# Patient Record
Sex: Male | Born: 2000 | Race: Black or African American | Hispanic: No | Marital: Single | Smoking: Never smoker
Health system: Southern US, Community
[De-identification: ages and names within clinical notes are randomized; demographics above are authoritative.]

## PROBLEM LIST (undated history)

## (undated) DIAGNOSIS — T7840XA Allergy, unspecified, initial encounter: Secondary | ICD-10-CM

## (undated) DIAGNOSIS — Z889 Allergy status to unspecified drugs, medicaments and biological substances status: Secondary | ICD-10-CM

## (undated) DIAGNOSIS — J45909 Unspecified asthma, uncomplicated: Secondary | ICD-10-CM

## (undated) DIAGNOSIS — Z87820 Personal history of traumatic brain injury: Secondary | ICD-10-CM

## (undated) DIAGNOSIS — Z8489 Family history of other specified conditions: Secondary | ICD-10-CM

## (undated) HISTORY — PX: WISDOM TOOTH EXTRACTION: SHX21

---
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
## 2022-08-09 ENCOUNTER — Ambulatory Visit
Admission: RE | Admit: 2022-08-09 | Discharge: 2022-08-09 | Disposition: A | Discharge status: Home / Self Care | Payer: Medicaid Other | Source: Ambulatory Visit | Attending: Physician Assistant | Admitting: Physician Assistant

## 2022-08-09 VITALS — BP 121/83 | HR 60 | Temp 98.4°F | Resp 16

## 2022-08-09 DIAGNOSIS — M25511 Pain in right shoulder: Secondary | ICD-10-CM

## 2022-08-09 MED ORDER — PREDNISONE 20 MG PO TABS: 40.0000 mg | ORAL_TABLET | Freq: Every day | ORAL | 0 refills | Status: AC

## 2022-08-09 NOTE — ED Provider Notes (Signed)
[facility]    CSN: [id number] Arrival date & time: 08/09/22  1701      History   Chief Complaint Chief Complaint  Patient presents with   Shoulder Pain    Entered by patient    HPI Richard [name] is a 21 y.o. male.   Patient here today for evaluation of right shoulder pain that started 2 weeks ago. He reports he does heavy lifting at work and this has exacerbated pain. He denies any new injury but does have history of right shoulder dislocation in the past. He has not been taking any medication for symptoms. He has to work this weekend and would like note for same.   The history is provided by the patient.  Shoulder Pain Associated symptoms: no fever     Past Medical History:  Diagnosis Date   Allergy    seasonal   Asthma    Family history of adverse reaction to anesthesia    Mother - vomitting   H/O seasonal allergies    History of multiple concussions     There are no problems to display for this patient.   Past Surgical History:  Procedure Laterality Date   BANKART REPAIR Right 08/28/2018   Procedure: RIGHT SHOULDER ARTHROSCOPIC BANKART REPAIR;  Surgeon: Nicholes Stairs, MD;  Location: [facility];  Service: Orthopedics;  Laterality: Right;  2 HRS   [place] Medications    Prior to Admission medications   Medication Sig Start Date End Date Taking? Authorizing Provider  predniSONE (DELTASONE) 20 MG tablet Take 2 tablets (40 mg total) by mouth daily with breakfast for 5 days. 08/09/22 08/14/22 Yes Francene Finders, PA-C  bismuth subsalicylate (PEPTO BISMOL) 262 MG/15ML suspension Take 30 mLs by mouth every 6 (six) hours as needed.    [provider]  ondansetron (ZOFRAN ODT) 4 MG disintegrating tablet Take 1 tablet (4 mg total) by mouth every 8 (eight) hours as needed for nausea or vomiting. 02/20/21   Wieters, Hallie C, PA-C  albuterol (PROVENTIL HFA;VENTOLIN HFA) 108 (90 BASE) MCG/ACT inhaler Inhale 2 puffs  into the lungs every 6 (six) hours as needed for shortness of breath.   02/20/21  [provider]  fluticasone (FLONASE) 50 MCG/ACT nasal spray Place 1-2 sprays into both nostrils daily. 01/02/21 02/20/21  Wieters, Elesa Hacker, PA-C    Family History Family History  Problem Relation Age of Onset   Arthritis Mother    Hypertension Mother    Hyperlipidemia Mother    Arthritis Father    Arthritis Maternal Grandmother    Diabetes Maternal Grandmother    Hyperlipidemia Maternal Grandmother    Hypertension Maternal Grandmother    Arthritis Maternal Grandfather    Diabetes Maternal Grandfather    Hyperlipidemia Maternal Grandfather    Hypertension Maternal Grandfather    Arthritis Paternal Grandmother    Arthritis Paternal Grandfather     Social History Social History   Tobacco Use   Smoking status: Never   Smokeless tobacco: Never  Vaping Use   Vaping Use: Never used  Substance Use Topics   Alcohol use: No   Drug use: No     Allergies   Patient has no known allergies.   Review of Systems Review of Systems  Constitutional:  Negative for chills and fever.  Eyes:  Negative for discharge and redness.  Musculoskeletal:  Positive for arthralgias. Negative for joint swelling.  Neurological:  Negative for numbness.  Physical Exam Triage Vital Signs ED Triage Vitals [08/09/22 1728]  Enc Vitals Group     BP 121/83     Pulse Rate 60     Resp 16     Temp 98.4 F (36.9 C)     Temp Source Oral     SpO2 98 %     Weight      Height      Head Circumference      Peak Flow      Pain Score 0     Pain Loc      Pain Edu?      Excl. in GC?    No data found.  Updated Vital Signs BP 121/83 (BP Location: Left Arm)   Pulse 60   Temp 98.4 F (36.9 C) (Oral)   Resp 16   SpO2 98%   Physical Exam Vitals and nursing note reviewed.  Constitutional:      General: He is not in acute distress.    Appearance: Normal appearance. He is not ill-appearing.  HENT:     Head:  Normocephalic and atraumatic.  Eyes:     Conjunctiva/sclera: Conjunctivae normal.  Cardiovascular:     Rate and Rhythm: Normal rate.  Pulmonary:     Effort: Pulmonary effort is normal.  Musculoskeletal:     Comments: Decreased outward rotation of right shoulder due to pain, full ROM otherwise. TTP noted to infraspinatus  Neurological:     Mental Status: He is alert.  Psychiatric:        Mood and Affect: Mood normal.        Behavior: Behavior normal.        Thought Content: Thought content normal.      UC Treatments / Results  Labs (all labs ordered are listed, but only abnormal results are displayed) Labs Reviewed - No data to display  EKG   Radiology No results found.  Procedures Procedures (including critical care time)  Medications Ordered in UC Medications - No data to display  Initial Impression / Assessment and Plan / UC Course  I have reviewed the triage vital signs and the nursing notes.  Pertinent labs & imaging results that were available during my care of the patient were reviewed by me and considered in my medical decision making (see chart for details).    Suspect muscle strain. Recommend steroid burst and encourage follow up if no gradual improvement. Work note provided as requested.   Final Clinical Impressions(s) / UC Diagnoses   Final diagnoses:  Acute pain of right shoulder   Discharge Instructions   None    ED Prescriptions     Medication Sig Dispense Auth. Provider   predniSONE (DELTASONE) 20 MG tablet Take 2 tablets (40 mg total) by mouth daily with breakfast for 5 days. 10 tablet Tomi Bamberger, PA-C      PDMP not reviewed this encounter.   Tomi Bamberger, PA-C 08/09/22 [phone]

## 2022-08-09 NOTE — ED Triage Notes (Signed)
Patient presents to UC for right shoulder pain x 2 weeks. 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 Hx of shoulder pain. Pain with movement. Not taking anything for pain relief.

## 2023-09-04 ENCOUNTER — Emergency Department (HOSPITAL_COMMUNITY): Payer: Medicaid Other

## 2023-09-04 ENCOUNTER — Other Ambulatory Visit: Payer: Self-pay

## 2023-09-04 ENCOUNTER — Encounter (HOSPITAL_COMMUNITY): Payer: Self-pay | Admitting: Emergency Medicine

## 2023-09-04 ENCOUNTER — Emergency Department (HOSPITAL_COMMUNITY)
Admission: EM | Admit: 2023-09-04 | Discharge: 2023-09-05 | Disposition: A | Discharge status: Home / Self Care | Payer: Medicaid Other | Attending: Emergency Medicine | Admitting: Emergency Medicine

## 2023-09-04 DIAGNOSIS — J45909 Unspecified asthma, uncomplicated: Secondary | ICD-10-CM | POA: Diagnosis not present

## 2023-09-04 DIAGNOSIS — M25531 Pain in right wrist: Secondary | ICD-10-CM | POA: Insufficient documentation
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
## 2023-09-05 MED ORDER — IBUPROFEN 800 MG PO TABS
800.0000 mg | ORAL_TABLET | Freq: Once | ORAL | Status: AC
  Administered 2023-09-05: 800 mg via ORAL
  Filled 2023-09-05: qty 1

## 2023-09-05 NOTE — Discharge Instructions (Signed)
You may take acetaminophen or ibuprofen at home for pain control.  You may remove the brace when your hand feels better.  I recommend icing of the hand for 20 minutes at a time every few hours until swelling subsides.

## 2023-09-05 NOTE — ED Provider Notes (Signed)
[place] EMERGENC[facility] DEPARTMENT AT [facility] Provider Note   CSN: [id number] Arrival date & time: 09/04/23  2323     History  Chief Complaint  Patient presents with   Wrist Pain    Richard [name] is a 22 y.o. male.  Patient presents to the emergency department complaining of right sided wrist and hand pain secondary to hitting a bookshelf.  Patient states he hit the shelf with the right fist, assuming his fist downward hitting along the outer portion of the fifth metacarpal.  He is able to move his fingers and has normal sensation.  Past medical history significant for asthma, concussion history   Wrist Pain       Home Medications Prior to Admission medications   Medication Sig Start Date End Date Taking? Authorizing Provider  bismuth subsalicylate (PEPTO BISMOL) 262 MG/15ML suspension Take 30 mLs by mouth every 6 (six) hours as needed.    [provider]  ondansetron (ZOFRAN ODT) 4 MG disintegrating tablet Take 1 tablet (4 mg total) by mouth every 8 (eight) hours as needed for nausea or vomiting. 02/20/21   Wieters, Hallie C, PA-C  albuterol (PROVENTIL HFA;VENTOLIN HFA) 108 (90 BASE) MCG/ACT inhaler Inhale 2 puffs into the lungs every 6 (six) hours as needed for shortness of breath.   02/20/21  [provider]  fluticasone (FLONASE) 50 MCG/ACT nasal spray Place 1-2 sprays into both nostrils daily. 01/02/21 02/20/21  Wieters, Hallie C, PA-C      Allergies    Patient has no known allergies.    Review of Systems   Review of Systems  Physical Exam Updated Vital Signs BP (!) 134/105 (BP Location: Right Arm)   Pulse 62   Temp 98.5 F (36.9 C) (Oral)   Resp 17   Ht 5\' 7"  (1.702 m)   Wt 65.8 kg   SpO2 100%   BMI 22.71 kg/m  Physical Exam Vitals and nursing note reviewed.  HENT:     Head: Normocephalic and atraumatic.  Eyes:     Pupils: Pupils are equal, round, and reactive to light.  Pulmonary:     Effort: Pulmonary effort is normal. No  respiratory distress.  Musculoskeletal:        General: Swelling, tenderness and signs of injury present. No deformity. Normal range of motion.     Cervical back: Normal range of motion.     Comments: Patient with swelling to the dorsal portion of the right hand.  Increased tenderness with palpation of the fifth and first metacarpals.  Patient with normal sensation throughout right hand.  Brisk cap refill.  Range of motion intact.  Skin:    General: Skin is dry.  Neurological:     Mental Status: He is alert.  Psychiatric:        Speech: Speech normal.        Behavior: Behavior normal.     ED Results / Procedures / Treatments   Labs (all labs ordered are listed, but only abnormal results are displayed) Labs Reviewed - No data to display  EKG None  Radiology DG Wrist Complete Right  Result Date: 09/05/2023 CLINICAL DATA:  Wrist pain/injury EXAM: RIGHT WRIST - COMPLETE 3+ VIEW COMPARISON:  None Available. FINDINGS: No fracture or dislocation is seen. The joint spaces are preserved. The visualized soft tissues are unremarkable. IMPRESSION: Negative. Electronically Signed   By: Charline Bills M.D.   On: 09/05/2023 00:40   DG Hand Complete Right  Result Date: 09/05/2023 CLINICAL DATA:  Hand pain/injury EXAM: RIGHT HAND - COMPLETE 3+ VIEW COMPARISON:  None Available. FINDINGS: No fracture or dislocation is seen. The joint spaces are preserved. Visualized soft tissues are within normal limits. IMPRESSION: Negative. Electronically Signed   By: Charline Bills M.D.   On: 09/05/2023 00:39    Procedures Procedures    Medications Ordered in ED Medications  ibuprofen (ADVIL) tablet 800 mg (800 mg Oral Given 09/05/23 0049)    ED Course/ Medical Decision Making/ A&P                                 Medical Decision Making Amount and/or Complexity of Data Reviewed Radiology: ordered.  Risk Prescription drug management.   This patient presents to the ED for concern of right  hand and wrist pain, this involves an extensive number of treatment options, and is a complaint that carries with it a high risk of complications and morbidity.  The differential diagnosis includes fracture, dislocation, soft tissue injury, others   Co morbidities that complicate the patient evaluation  None    Imaging Studies ordered:  I ordered imaging studies including plain films of the right hand and wrist I independently visualized and interpreted imaging which showed no fracture or dislocation I agree with the radiologist interpretation   Problem List / ED Course / Critical interventions / Medication management   I ordered medication including ibuprofen for inflammation Reevaluation of the patient after these medicines showed that the patient improved I have reviewed the patients home medicines and have made adjustments as needed   Social Determinants of Health:  Patient has Medicaid for his primary health insurance type   Test / Admission - Considered:  No fracture or dislocation on imaging.  Injury seems to be soft tissue.  Patient was provided with a brace for comfort.  He feels better after ibuprofen.  Plan to discharge home at this time with recommendations for ibuprofen/acetaminophen at home.         Final Clinical Impression(s) / ED Diagnoses Final diagnoses:  Acute pain of right wrist    Rx / DC Orders ED Discharge Orders     None         Pamala Duffel 09/05/23 0112    Nira Conn, MD 09/05/23 [phone]

## 2024-02-08 ENCOUNTER — Emergency Department (HOSPITAL_COMMUNITY)
Admission: EM | Admit: 2024-02-08 | Discharge: 2024-02-08 | Disposition: A | Discharge status: Home / Self Care | Attending: Emergency Medicine | Admitting: Emergency Medicine

## 2024-02-08 ENCOUNTER — Emergency Department (HOSPITAL_COMMUNITY)

## 2024-02-08 ENCOUNTER — Other Ambulatory Visit: Payer: Self-pay

## 2024-02-08 ENCOUNTER — Encounter (HOSPITAL_COMMUNITY): Payer: Self-pay

## 2024-02-08 DIAGNOSIS — M25561 Pain in right knee: Secondary | ICD-10-CM | POA: Insufficient documentation

## 2024-02-08 DIAGNOSIS — J45909 Unspecified asthma, uncomplicated: Secondary | ICD-10-CM | POA: Diagnosis not present

## 2024-02-08 MED ORDER — NAPROXEN 500 MG PO TABS
500.0000 mg | ORAL_TABLET | Freq: Once | ORAL | Status: AC
  Administered 2024-02-08: 500 mg via ORAL
  Filled 2024-02-08: qty 1

## 2024-02-08 MED ORDER — NAPROXEN 500 MG PO TABS: 500.0000 mg | ORAL_TABLET | Freq: Two times a day (BID) | ORAL | 0 refills | Status: AC

## 2024-02-08 NOTE — Discharge Instructions (Addendum)
 Thank you for letting us  evaluate you today.  Your x-rays were negative for fracture.  However, this does not rule out a ligamentous or meniscal injury, strain, sprain, tear.  Therefore, I provided you with orthopedic follow-up for further management.  Return to Emergency Department if you experience significant worsening of pain, swelling, numbness or tingling of your leg, discoloration of your leg

## 2024-02-08 NOTE — ED Provider Notes (Signed)
 [place] EMERGENCY DEPARTMENT AT [facility] Provider Note   CSN: [id number] Arrival date & time: 02/08/24  1158     History  Chief Complaint  Patient presents with   Knee Pain    Beverly Suriano is a 23 y.o. male with past medical history of asthma presents to emergency department for evaluation of right knee pain that started 2 weeks ago.  He reports that he was attempting to get out of bed when he heard a "pop" with sudden pain and swelling.  He reports that swelling has resolved however pain persists.  He has had 1 episode where he has heard a "pop" in his knee in the past but was never evaluated for it.  Has tried ibuprofen  with some relief to pain.  Denies fevers, paresthesia, weakness.   Knee Pain Associated symptoms: no fatigue and no fever        Home Medications Prior to Admission medications   Medication Sig Start Date End Date Taking? Authorizing Provider  naproxen  (NAPROSYN ) 500 MG tablet Take 1 tablet (500 mg total) by mouth 2 (two) times daily. 02/08/24  Yes Royann Cords, PA  bismuth subsalicylate (PEPTO BISMOL) 262 MG/15ML suspension Take 30 mLs by mouth every 6 (six) hours as needed.    [provider]  ondansetron  (ZOFRAN  ODT) 4 MG disintegrating tablet Take 1 tablet (4 mg total) by mouth every 8 (eight) hours as needed for nausea or vomiting. 02/20/21   Wieters, Hallie C, PA-C  albuterol (PROVENTIL HFA;VENTOLIN HFA) 108 (90 BASE) MCG/ACT inhaler Inhale 2 puffs into the lungs every 6 (six) hours as needed for shortness of breath.   02/20/21  [provider]  fluticasone  (FLONASE ) 50 MCG/ACT nasal spray Place 1-2 sprays into both nostrils daily. 01/02/21 02/20/21  Wieters, Hallie C, PA-C      Allergies    Patient has no known allergies.    Review of Systems   Review of Systems  Constitutional:  Negative for chills, fatigue and fever.  Respiratory:  Negative for cough, chest tightness, shortness of breath and wheezing.    Cardiovascular:  Negative for chest pain and palpitations.  Gastrointestinal:  Negative for abdominal pain, constipation, diarrhea, nausea and vomiting.  Neurological:  Negative for dizziness, seizures, weakness, light-headedness, numbness and headaches.    Physical Exam Updated Vital Signs BP 116/80 (BP Location: Right Arm)   Pulse (!) 59   Temp 98.6 F (37 C) (Oral)   Resp 16   Ht 5\' 7"  (1.702 m)   Wt 63.5 kg   SpO2 98%   BMI 21.93 kg/m  Physical Exam Vitals and nursing note reviewed.  Constitutional:      General: He is not in acute distress.    Appearance: Normal appearance.  HENT:     Head: Normocephalic and atraumatic.  Eyes:     Conjunctiva/sclera: Conjunctivae normal.  Cardiovascular:     Rate and Rhythm: Normal rate.  Pulmonary:     Effort: Pulmonary effort is normal. No respiratory distress.  Musculoskeletal:     Right lower leg: No edema.     Left lower leg: No edema.     Comments: Diffuse mild tenderness to palpation of right knee.  No swelling, warmth, erythema.  No swelling of BLE.  DP 2+.  Motor 5/5 of right ankle dorsi and plantarflexion, right knee flexion and extension.  Sensation 2/2 RLE  Skin:    Coloration: Skin is not jaundiced or pale.  Neurological:     Mental Status:  He is alert. Mental status is at baseline.     ED Results / Procedures / Treatments   Labs (all labs ordered are listed, but only abnormal results are displayed) Labs Reviewed - No data to display  EKG None  Radiology DG Knee Complete 4 Views Right Result Date: 02/08/2024 CLINICAL DATA:  3 history of right knee pain starting when getting out of bed. Initial pop with sudden pain and swelling. EXAM: RIGHT KNEE - COMPLETE 4+ VIEW COMPARISON:  None Available. FINDINGS: Normal bone mineralization. Joint spaces are preserved. No joint effusion. No acute fracture or dislocation. IMPRESSION: Normal right knee radiographs. Electronically Signed   By: Bertina Broccoli M.D.   On: 02/08/2024  13:42    Procedures Procedures    Medications Ordered in ED Medications  naproxen  (NAPROSYN ) tablet 500 mg (500 mg Oral Given 02/08/24 1444)    ED Course/ Medical Decision Making/ A&P                                 Medical Decision Making Amount and/or Complexity of Data Reviewed Radiology: ordered.  Risk Prescription drug management.   Patient presents to the ED for concern of right knee pain, this involves an extensive number of treatment options, and is a complaint that carries with it a high risk of complications and morbidity.  The differential diagnosis includes fracture, contusion, dislocation, ligamentous injury, meniscal injury, septic joint, DVT, Baker's cyst, cellulitis   Co morbidities that complicate the patient evaluation  None   Additional history obtained:  Additional history obtained from Nursing   External records from outside source obtained and reviewed including triage RN note    Imaging Studies ordered:  I ordered imaging studies including knee x-ray I independently visualized and interpreted imaging which showed no traumatic osseous injury I agree with the radiologist interpretation     Medicines ordered and prescription drug management:  I ordered medication including naproxen  for pain Reevaluation of the patient after these medicines showed that the patient improved I have reviewed the patients home medicines and have made adjustments as needed     Problem List / ED Course:  Right knee pain X-rays negative for fracture.  However, this does not rule out ligamentous, meniscal injury.  Will provide knee immobilizer, crutches and orthopedic follow-up for further management Low suspicion for emergent cause of pain to include septic joint, DVT, ischemic limb with reassuring physical exam.  Vital signs WNL with no fever nor tachycardia. Limb well perfused Provide naproxen  prescription   Reevaluation:  After the interventions noted  above, I reevaluated the patient and found that they have :improved    Dispostion:  After consideration of the diagnostic results and the patients response to treatment, I feel that the patent would benefit from outpatient management with orthopedic follow-up.   Discussed ED workup, disposition, return to ED precautions with patient who expresses understanding agrees with plan.  All questions answered to their satisfaction.  They are agreeable to plan.  Discharge instructions provided on paperwork Final Clinical Impression(s) / ED Diagnoses Final diagnoses:  Acute pain of right knee    Rx / DC Orders ED Discharge Orders          Ordered    naproxen  (NAPROSYN ) 500 MG tablet  2 times daily        02/08/24 1431              Royann Cords, PA  02/08/24 1844    Wynetta Heckle, MD 02/08/24 2250

## 2024-02-08 NOTE — Progress Notes (Signed)
 Orthopedic Tech Progress Note Patient Details:  Richard [name] 05-Apr-2001 [id number]  Ortho Devices Type of Ortho Device: Crutches, Knee Immobilizer Ortho Device/Splint Location: RLE Ortho Device/Splint Interventions: Ordered, Application, Adjustment   Post Interventions Patient Tolerated: Well Instructions Provided: Adjustment of device, Care of device, Poper ambulation with device  Herbie Loll 02/08/2024, 2:46 PM

## 2024-02-08 NOTE — ED Triage Notes (Signed)
 Pt presents with a 2 week hx of R knee pain that started when he was getting out of bed. He initially had a pop with sudden pain and swelling. The swelling has resolved, but the pain persists. Denies any swelling, numbness or paresthesias distally. 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 He has not been evaluated previously. He did take ibuprofen  once and had temporarily relief.
